# Patient Record
Sex: Female | Born: 1980 | Race: White | Hispanic: No | Marital: Married | Smoking: Never smoker
Health system: Southern US, Community
[De-identification: ages and names within clinical notes are randomized; demographics above are authoritative.]

## PROBLEM LIST (undated history)

## (undated) ENCOUNTER — Inpatient Hospital Stay (HOSPITAL_COMMUNITY): Payer: Self-pay

## (undated) DIAGNOSIS — Z789 Other specified health status: Secondary | ICD-10-CM

## (undated) HISTORY — PX: NO PAST SURGERIES: SHX2092

---

## 2017-07-23 ENCOUNTER — Other Ambulatory Visit: Payer: Self-pay

## 2017-07-23 ENCOUNTER — Inpatient Hospital Stay (HOSPITAL_COMMUNITY)
Admission: AD | Admit: 2017-07-23 | Discharge: 2017-07-23 | Disposition: A | Discharge status: Home / Self Care | Payer: BLUE CROSS/BLUE SHIELD | Source: Ambulatory Visit | Attending: Obstetrics and Gynecology | Admitting: Obstetrics and Gynecology

## 2017-07-23 ENCOUNTER — Inpatient Hospital Stay (HOSPITAL_COMMUNITY): Payer: BLUE CROSS/BLUE SHIELD

## 2017-07-23 ENCOUNTER — Encounter (HOSPITAL_COMMUNITY): Payer: Self-pay

## 2017-07-23 DIAGNOSIS — R109 Unspecified abdominal pain: Secondary | ICD-10-CM | POA: Diagnosis present

## 2017-07-23 DIAGNOSIS — O3680X0 Pregnancy with inconclusive fetal viability, not applicable or unspecified: Secondary | ICD-10-CM

## 2017-07-23 DIAGNOSIS — O418X10 Other specified disorders of amniotic fluid and membranes, first trimester, not applicable or unspecified: Secondary | ICD-10-CM | POA: Diagnosis not present

## 2017-07-23 DIAGNOSIS — Z79899 Other long term (current) drug therapy: Secondary | ICD-10-CM | POA: Insufficient documentation

## 2017-07-23 DIAGNOSIS — O208 Other hemorrhage in early pregnancy: Secondary | ICD-10-CM | POA: Insufficient documentation

## 2017-07-23 DIAGNOSIS — Z3401 Encounter for supervision of normal first pregnancy, first trimester: Secondary | ICD-10-CM

## 2017-07-23 DIAGNOSIS — Z3A01 Less than 8 weeks gestation of pregnancy: Secondary | ICD-10-CM | POA: Diagnosis not present

## 2017-07-23 DIAGNOSIS — O468X1 Other antepartum hemorrhage, first trimester: Secondary | ICD-10-CM

## 2017-07-23 LAB — URINALYSIS, ROUTINE W REFLEX MICROSCOPIC
BILIRUBIN URINE: NEGATIVE
GLUCOSE, UA: NEGATIVE mg/dL
KETONES UR: 5 mg/dL — AB
Nitrite: NEGATIVE
Protein, ur: NEGATIVE mg/dL
Specific Gravity, Urine: 1.005 (ref 1.005–1.030)
pH: 6 (ref 5.0–8.0)

## 2017-07-23 LAB — WET PREP, GENITAL
Clue Cells Wet Prep HPF POC: NONE SEEN
Sperm: NONE SEEN
TRICH WET PREP: NONE SEEN
YEAST WET PREP: NONE SEEN

## 2017-07-23 LAB — POCT PREGNANCY, URINE: Preg Test, Ur: POSITIVE — AB

## 2017-07-23 NOTE — MAU Note (Signed)
Pt presents to MAU with complaints of brown vaginal spotting that started earlier today. Reports lower abdominal discomfort.

## 2017-07-23 NOTE — Discharge Instructions (Signed)
Subchorionic Hematoma °A subchorionic hematoma is a gathering of blood between the outer wall of the placenta and the inner wall of the womb (uterus). The placenta is the organ that connects the fetus to the wall of the uterus. The placenta performs the feeding, breathing (oxygen to the fetus), and waste removal (excretory work) of the fetus. °Subchorionic hematoma is the most common abnormality found on a result from ultrasonography done during the first trimester or early second trimester of pregnancy. If there has been little or no vaginal bleeding, early small hematomas usually shrink on their own and do not affect your baby or pregnancy. The blood is gradually absorbed over 1-2 weeks. When bleeding starts later in pregnancy or the hematoma is larger or occurs in an older pregnant woman, the outcome may not be as good. Larger hematomas may get bigger, which increases the chances for miscarriage. Subchorionic hematoma also increases the risk of premature detachment of the placenta from the uterus, preterm (premature) labor, and stillbirth. °Follow these instructions at home: °· Stay on bed rest if your health care provider recommends this. Although bed rest will not prevent more bleeding or prevent a miscarriage, your health care provider may recommend bed rest until you are advised otherwise. °· Avoid heavy lifting (more than 10 lb [4.5 kg]), exercise, sexual intercourse, or douching as directed by your health care provider. °· Keep track of the number of pads you use each day and how soaked (saturated) they are. Write down this information. °· Do not use tampons. °· Keep all follow-up appointments as directed by your health care provider. Your health care provider may ask you to have follow-up blood tests or ultrasound tests or both. °Get help right away if: °· You have severe cramps in your stomach, back, abdomen, or pelvis. °· You have a fever. °· You pass large clots or tissue. Save any tissue for your  health care provider to look at. °· Your bleeding increases or you become lightheaded, feel weak, or have fainting episodes. °This information is not intended to replace advice given to you by your health care provider. Make sure you discuss any questions you have with your health care provider. °Document Released: 05/01/2006 Document Revised: 06/22/2015 Document Reviewed: 08/13/2012 °Elsevier Interactive Patient Education © 2017 Elsevier Inc. ° °

## 2017-07-23 NOTE — MAU Provider Note (Signed)
History     CSN: [id number]  Arrival date and time: 07/23/17 1518   First Provider Initiated Contact with Patient 07/23/17 1616      Chief Complaint  Patient presents with  . Vaginal Bleeding  . Abdominal Pain  . Possible Pregnancy   HPI  Barbara Lambert is a 37 y.o. G1P0 at approximately six weeks GA by LMP who presents to MAU with report of abdominal cramping and spotting today. Denies leaking of fluid, fever, falls, or recent illness.  Denies sexual intercourse  OB History    Gravida  1   Para      Term      Preterm      AB      Living  0     SAB      TAB      Ectopic      Multiple      Live Births              History reviewed. No pertinent past medical history.  History reviewed. No pertinent surgical history.  History reviewed. No pertinent family history.  Social History   Tobacco Use  . Smoking status: Never Smoker  . Smokeless tobacco: Never Used  Substance Use Topics  . Alcohol use: Not Currently  . Drug use: Never    Allergies: No Known Allergies  Medications Prior to Admission  Medication Sig Dispense Refill Last Dose  . fluticasone (FLONASE) 50 MCG/ACT nasal spray Place 1 spray into both nostrils daily as needed for allergies or rhinitis.   Past Week at Unknown time  . Prenatal Vit-Fe Fumarate-FA (PRENATAL MULTIVITAMIN) TABS tablet Take 1 tablet by mouth daily at 12 noon.   07/22/2017 at Unknown time    Review of Systems  Gastrointestinal: Positive for abdominal pain. Negative for nausea and vomiting.       "crampy" pain 3/10 in lower abdomen  Genitourinary: Positive for vaginal bleeding. Negative for difficulty urinating, dyspareunia, dysuria, pelvic pain, vaginal discharge and vaginal pain.  Neurological: Negative for weakness and headaches.   Physical Exam   Blood pressure 117/81, pulse 97, temperature 98.5 F (36.9 C), resp. rate 16, height 4\' 7"  (1.397 m), weight 104 lb (47.2 kg), last menstrual period  06/07/2017.  Physical Exam  Nursing note and vitals reviewed. Constitutional: She is oriented to person, place, and time. She appears well-developed and well-nourished. She appears distressed.  Very anxious  Cardiovascular: Normal rate, regular rhythm, normal heart sounds and intact distal pulses.  Respiratory: Effort normal and breath sounds normal.  GI: Soft. Bowel sounds are normal.  Genitourinary: Vagina normal and uterus normal.  Genitourinary Comments: Scant brown mucus visible on exam  Neurological: She is alert and oriented to person, place, and time. She has normal reflexes.  Skin: Skin is warm and dry.  Psychiatric: She has a normal mood and affect. Her behavior is normal. Judgment and thought content normal.   Scant brown vaginal discharge visible on panties worn to MAU Small streaks of brown discharge predominantly mucus visible after exam  MAU Course  Procedures  MDM Orders Placed This Encounter  Procedures  . Wet prep, genital    Standing Status:   Standing    Number of Occurrences:   1  . US OB LESS THAN 14 WEEKS WITH OB TRANSVAGINAL    Cramping and spotting in first trimester    Standing Status:   Standing    Number of Occurrences:   1    Order Specific Question:  Symptom/Reason for Exam    Answer:   Pregnancy of unknown anatomic location [741014]  . Urinalysis, Routine w reflex microscopic    Standing Status:   Standing    Number of Occurrences:   1  . Pregnancy, urine POC    Standing Status:   Standing    Number of Occurrences:   1   Results for orders placed or performed during the hospital encounter of 07/23/17 (from the past 24 hour(s))  Urinalysis, Routine w reflex microscopic     Status: Abnormal   Collection Time: 07/23/17  3:57 PM  Result Value Ref Range   Color, Urine STRAW (A) YELLOW   APPearance CLEAR CLEAR   Specific Gravity, Urine 1.005 1.005 - 1.030   pH 6.0 5.0 - 8.0   Glucose, UA NEGATIVE NEGATIVE mg/dL   Hgb urine dipstick MODERATE  (A) NEGATIVE   Bilirubin Urine NEGATIVE NEGATIVE   Ketones, ur 5 (A) NEGATIVE mg/dL   Protein, ur NEGATIVE NEGATIVE mg/dL   Nitrite NEGATIVE NEGATIVE   Leukocytes, UA TRACE (A) NEGATIVE   RBC / HPF 0-5 0 - 5 RBC/hpf   WBC, UA 0-5 0 - 5 WBC/hpf   Bacteria, UA RARE (A) NONE SEEN   Squamous Epithelial / LPF 0-5 0 - 5  Pregnancy, urine POC     Status: Abnormal   Collection Time: 07/23/17  4:02 PM  Result Value Ref Range   Preg Test, Ur POSITIVE (A) NEGATIVE  Wet prep, genital     Status: Abnormal   Collection Time: 07/23/17  4:26 PM  Result Value Ref Range   Yeast Wet Prep HPF POC NONE SEEN NONE SEEN   Trich, Wet Prep NONE SEEN NONE SEEN   Clue Cells Wet Prep HPF POC NONE SEEN NONE SEEN   WBC, Wet Prep HPF POC MODERATE (A) NONE SEEN   Sperm NONE SEEN     US Ob Less Than 14 Weeks With Ob Transvaginal  Result Date: 07/23/2017 CLINICAL DATA:  37 year old pregnant female with spotting and lower abdominal discomfort. EDC by LMP: 03/14/2018, projecting to an expected gestational age of [redacted] weeks 4 days. EXAM: OBSTETRIC <14 WK [place] AND TRANSVAGINAL OB US TECHNIQUE: Both transabdominal and transvaginal ultrasound examinations were performed for complete evaluation of the gestation as well as the maternal uterus, adnexal regions, and pelvic cul-de-sac. Transvaginal technique was performed to assess early pregnancy. COMPARISON:  None. FINDINGS: Intrauterine gestational sac: Single intrauterine gestational sac appears normal in size, shape and position. Yolk sac:  Visualized. Embryo:  Visualized. Cardiac Activity: Visualized. Heart Rate: 126 bpm CRL:  7.7 mm   6 w   4 d                  [place] EDC: 03/14/2018 Subchorionic hemorrhage: Moderate perigestational bleed involving approximately 50% of the gestational sac circumference and extending into lower cavity and endocervical canal. Maternal uterus/adnexae: Left ovary measures 4.0 x 2.7 x 2.1 cm and contains a corpus luteum. Right ovary measures 3.9 x 1.9 x 2.0  cm. No abnormal ovarian or adnexal masses. Anteverted uterus, with no uterine fibroids. No abnormal free fluid in the pelvis. IMPRESSION: 1. Single living intrauterine gestation at 6 weeks 4 days by crown-rump length, concordant with provided menstrual dating. Normal embryonic cardiac activity. 2. Moderate perigestational bleed as detailed. 3. No abnormal ovarian or adnexal masses. Electronically Signed   By: Delbert Phenix M.D.   On: 07/23/2017 17:23     Assessment and Plan  --37 y.o. G1P0 with  SIUP at 6w 4d by US performed today --Subchorionic hemorrhage.  --Discussed possibility of future bleeding episodes and confirmed patient should return to MAU if they occur --Reviewed pelvis rest protocol, nothing in vagina --Given safe medications handout --Patient planning to initiate [facility] --Discharge home in stable condition  Calvert Cantor, CNM 07/23/2017, 5:40 PM

## 2017-07-24 LAB — GC/CHLAMYDIA PROBE AMP (~~LOC~~) NOT AT ARMC
CHLAMYDIA, DNA PROBE: NEGATIVE
Neisseria Gonorrhea: NEGATIVE

## 2017-09-10 ENCOUNTER — Inpatient Hospital Stay (HOSPITAL_COMMUNITY)
Admission: AD | Admit: 2017-09-10 | Discharge: 2017-09-10 | Disposition: A | Discharge status: Home / Self Care | Payer: BLUE CROSS/BLUE SHIELD | Source: Ambulatory Visit | Attending: Obstetrics and Gynecology | Admitting: Obstetrics and Gynecology

## 2017-09-10 ENCOUNTER — Inpatient Hospital Stay (HOSPITAL_COMMUNITY): Payer: BLUE CROSS/BLUE SHIELD

## 2017-09-10 ENCOUNTER — Other Ambulatory Visit: Payer: Self-pay

## 2017-09-10 ENCOUNTER — Encounter (HOSPITAL_COMMUNITY): Payer: Self-pay | Admitting: *Deleted

## 2017-09-10 DIAGNOSIS — O4690 Antepartum hemorrhage, unspecified, unspecified trimester: Secondary | ICD-10-CM

## 2017-09-10 DIAGNOSIS — O208 Other hemorrhage in early pregnancy: Secondary | ICD-10-CM | POA: Insufficient documentation

## 2017-09-10 DIAGNOSIS — O468X9 Other antepartum hemorrhage, unspecified trimester: Secondary | ICD-10-CM

## 2017-09-10 DIAGNOSIS — O09519 Supervision of elderly primigravida, unspecified trimester: Secondary | ICD-10-CM

## 2017-09-10 DIAGNOSIS — Z3A13 13 weeks gestation of pregnancy: Secondary | ICD-10-CM | POA: Diagnosis not present

## 2017-09-10 DIAGNOSIS — O418X90 Other specified disorders of amniotic fluid and membranes, unspecified trimester, not applicable or unspecified: Secondary | ICD-10-CM

## 2017-09-10 DIAGNOSIS — O209 Hemorrhage in early pregnancy, unspecified: Secondary | ICD-10-CM | POA: Diagnosis present

## 2017-09-10 DIAGNOSIS — O4691 Antepartum hemorrhage, unspecified, first trimester: Secondary | ICD-10-CM

## 2017-09-10 DIAGNOSIS — Z3492 Encounter for supervision of normal pregnancy, unspecified, second trimester: Secondary | ICD-10-CM

## 2017-09-10 HISTORY — DX: Other specified health status: Z78.9

## 2017-09-10 LAB — TYPE AND SCREEN
ABO/RH(D): B POS
ANTIBODY SCREEN: NEGATIVE

## 2017-09-10 LAB — URINALYSIS, ROUTINE W REFLEX MICROSCOPIC
Bilirubin Urine: NEGATIVE
Glucose, UA: NEGATIVE mg/dL
Ketones, ur: NEGATIVE mg/dL
Leukocytes, UA: NEGATIVE
Nitrite: NEGATIVE
PROTEIN: NEGATIVE mg/dL
SPECIFIC GRAVITY, URINE: 1.002 — AB (ref 1.005–1.030)
pH: 6 (ref 5.0–8.0)

## 2017-09-10 LAB — CBC
HEMATOCRIT: 37.4 % (ref 36.0–46.0)
HEMOGLOBIN: 13.1 g/dL (ref 12.0–15.0)
MCH: 29.8 pg (ref 26.0–34.0)
MCHC: 35 g/dL (ref 30.0–36.0)
MCV: 85.2 fL (ref 78.0–100.0)
Platelets: 268 10*3/uL (ref 150–400)
RBC: 4.39 MIL/uL (ref 3.87–5.11)
RDW: 12.6 % (ref 11.5–15.5)
WBC: 12.1 10*3/uL — AB (ref 4.0–10.5)

## 2017-09-10 LAB — WET PREP, GENITAL
Clue Cells Wet Prep HPF POC: NONE SEEN
Sperm: NONE SEEN
Trich, Wet Prep: NONE SEEN
Yeast Wet Prep HPF POC: NONE SEEN

## 2017-09-10 LAB — HCG, QUANTITATIVE, PREGNANCY: hCG, Beta Chain, Quant, S: 64435 m[IU]/mL — ABNORMAL HIGH (ref <5)

## 2017-09-10 LAB — ABO/RH: ABO/RH(D): B POS

## 2017-09-10 NOTE — MAU Note (Signed)
Has been bleeding since Monday, started as 2 drops of dark red, then reddish pink when wiped, the next day was brown, today light brown.some discomfort when she urinates.

## 2017-09-10 NOTE — MAU Note (Signed)
Urine in lab 

## 2017-09-10 NOTE — MAU Provider Note (Signed)
History     CSN: [id number]  Arrival date and time: 09/10/17 1315   First Provider Initiated Contact with Patient 09/10/17 1511      Chief Complaint  Patient presents with  . Dysuria  . Vaginal Bleeding   HPI  Barbara Lambert is a 37 y.o. G1P0 at 8943w4d who presents to MAU with chief complaint of vaginal bleeding since Monday 09/08/17. Patient endorses vaginal spotting that ranges from pale pink mucous to bright red spotting. Also reports "a little tingle" in her stomach when she voids, new onset Monday 09/08/2017. Denies heavy vaginal bleeding, change in vaginal discharge, fever, falls, or recent illness.  Endorses compliance with previous recommendation for pelvic rest based on diagnosis of subchorionic hemorrhage.  OB History    Gravida  1   Para      Term      Preterm      AB      Living  0     SAB      TAB      Ectopic      Multiple      Live Births              Past Medical History:  Diagnosis Date  . Medical history non-contributory     Past Surgical History:  Procedure Laterality Date  . NO PAST SURGERIES      History reviewed. No pertinent family history.  Social History   Tobacco Use  . Smoking status: Never Smoker  . Smokeless tobacco: Never Used  Substance Use Topics  . Alcohol use: Not Currently  . Drug use: Never    Allergies: No Known Allergies  Medications Prior to Admission  Medication Sig Dispense Refill Last Dose  . Prenatal Vit-Fe Fumarate-FA (PRENATAL MULTIVITAMIN) TABS tablet Take 1 tablet by mouth daily at 12 noon.   07/22/2017 at Unknown time    Review of Systems  Constitutional: Negative for fever.  Respiratory: Negative for shortness of breath.   Gastrointestinal: Negative for abdominal pain, nausea and vomiting.  Genitourinary: Positive for dysuria and vaginal bleeding. Negative for difficulty urinating, vaginal discharge and vaginal pain.  Neurological: Negative for headaches.  All other systems reviewed and are  negative.  Physical Exam   Blood pressure 114/76, pulse 96, temperature 98.4 F (36.9 C), temperature source Oral, resp. rate 17, weight 47.9 kg, last menstrual period 06/07/2017, SpO2 100 %.  Physical Exam  Nursing note and vitals reviewed. Constitutional: She is oriented to person, place, and time. She appears well-developed and well-nourished.  Cardiovascular: Normal rate and intact distal pulses.  Respiratory: Effort normal.  GI: Soft. Bowel sounds are normal. There is no tenderness. There is no rebound and no guarding.  Genitourinary: Vagina normal and uterus normal.  Musculoskeletal: Normal range of motion.  Neurological: She is alert and oriented to person, place, and time. She has normal reflexes.  Skin: Skin is warm and dry.  Psychiatric: She has a normal mood and affect. Her behavior is normal. Judgment and thought content normal.    MAU Course  Procedures  MDM --FHT 152 in MAU --No abnormal vaginal discharge or bleeding visible on swab collection --Patient declined bimanual exam --previously diagnosed subchorionic hematoma  Patient Vitals for the past 24 hrs:  BP Temp Temp src Pulse Resp SpO2 Weight  09/10/17 1410 114/76 98.4 F (36.9 C) Oral 96 17 100 % 47.9 kg    Orders Placed This Encounter  Procedures  . Wet prep, genital  . US OB LESS  THAN 14 WEEKS WITH OB TRANSVAGINAL  . Urinalysis, Routine w reflex microscopic  . CBC  . hCG, quantitative, pregnancy  . Type and screen [facility] [place]  . ABO/Rh    Results for orders placed or performed during the hospital encounter of 09/10/17 (from the past 24 hour(s))  Urinalysis, Routine w reflex microscopic     Status: Abnormal   Collection Time: 09/10/17  1:24 PM  Result Value Ref Range   Color, Urine STRAW (A) YELLOW   APPearance CLEAR CLEAR   Specific Gravity, Urine 1.002 (L) 1.005 - 1.030   pH 6.0 5.0 - 8.0   Glucose, UA NEGATIVE NEGATIVE mg/dL   Hgb urine dipstick SMALL (A) NEGATIVE    Bilirubin Urine NEGATIVE NEGATIVE   Ketones, ur NEGATIVE NEGATIVE mg/dL   Protein, ur NEGATIVE NEGATIVE mg/dL   Nitrite NEGATIVE NEGATIVE   Leukocytes, UA NEGATIVE NEGATIVE   RBC / HPF 0-5 0 - 5 RBC/hpf   WBC, UA 0-5 0 - 5 WBC/hpf   Bacteria, UA RARE (A) NONE SEEN   Squamous Epithelial / LPF 0-5 0 - 5  CBC     Status: Abnormal   Collection Time: 09/10/17  2:40 PM  Result Value Ref Range   WBC 12.1 (H) 4.0 - 10.5 K/uL   RBC 4.39 3.87 - 5.11 MIL/uL   Hemoglobin 13.1 12.0 - 15.0 g/dL   HCT 03.4 74.2 - 59.5 %   MCV 85.2 78.0 - 100.0 fL   MCH 29.8 26.0 - 34.0 pg   MCHC 35.0 30.0 - 36.0 g/dL   RDW 63.8 75.6 - 43.3 %   Platelets 268 150 - 400 K/uL  hCG, quantitative, pregnancy     Status: Abnormal   Collection Time: 09/10/17  2:40 PM  Result Value Ref Range   hCG, Beta Chain, Quant, S 64,435 (H) <5 mIU/mL  Type and screen [facility]      Status: None   Collection Time: 09/10/17  2:40 PM  Result Value Ref Range   ABO/RH(D) B POS    Antibody Screen NEG    Sample Expiration      09/13/2017 Performed at [facility], [address], [place], [place] [address]   ABO/Rh     Status: None   Collection Time: 09/10/17  2:40 PM  Result Value Ref Range   ABO/RH(D)      B POS Performed at [facility], [address], [place], [place] [address]   Wet prep, genital     Status: Abnormal   Collection Time: 09/10/17  3:21 PM  Result Value Ref Range   Yeast Wet Prep HPF POC NONE SEEN NONE SEEN   Trich, Wet Prep NONE SEEN NONE SEEN   Clue Cells Wet Prep HPF POC NONE SEEN NONE SEEN   WBC, Wet Prep HPF POC FEW (A) NONE SEEN   Sperm NONE SEEN    US Ob Comp Less 14 Wks  Result Date: 09/10/2017 CLINICAL DATA:  Pregnant, vaginal bleeding EXAM: OBSTETRIC <14 WK ULTRASOUND TECHNIQUE: Transvaginal ultrasound was performed for complete evaluation of the gestation as well as the maternal uterus, adnexal regions, and pelvic cul-de-sac. COMPARISON:  None. FINDINGS:  Intrauterine gestational sac: Single Yolk sac:  Not Visualized. Embryo:  Visualized. Cardiac Activity: Visualized. Heart Rate: 146 bpm CRL:   74.2 mm   13 w 4 d                  [place] EDC: 03/14/2018 Subchorionic hemorrhage:  None visualized.  Maternal uterus/adnexae: Bilateral ovaries are not discretely visualized. No free fluid. IMPRESSION: Single live intrauterine gestation, with estimated gestational age [redacted] weeks 4 days by crown-rump length, as above. Electronically Signed   By: Charline BillsSriyesh  Krishnan M.D.   On: 09/10/2017 16:38       Assessment and Plan  --37 y.o. G1P0 at 5773w4d  --FHT 154 via Doppler --Subchorionic hemorrhage now resolved --Blood type B POSITIVE --Discharge home in stable condition  Presentation, clinical findings, and plan discussed with Dr. Richardson Doppole.   Calvert CantorSamantha C Weinhold, CNM 09/10/2017, 4:53 PM

## 2017-09-10 NOTE — Discharge Instructions (Signed)
Second Trimester of Pregnancy The second trimester is from week 13 through week 28, month 4 through 6. This is often the time in pregnancy that you feel your best. Often times, morning sickness has lessened or quit. You may have more energy, and you may get hungry more often. Your unborn baby (fetus) is growing rapidly. At the end of the sixth month, he or she is about 9 inches long and weighs about 1 pounds. You will likely feel the baby move (quickening) between 18 and 20 weeks of pregnancy. Follow these instructions at home:  Avoid all smoking, herbs, and alcohol. Avoid drugs not approved by your doctor.  Do not use any tobacco products, including cigarettes, chewing tobacco, and electronic cigarettes. If you need help quitting, ask your doctor. You may get counseling or other support to help you quit.  Only take medicine as told by your doctor. Some medicines are safe and some are not during pregnancy.  Exercise only as told by your doctor. Stop exercising if you start having cramps.  Eat regular, healthy meals.  Wear a good support bra if your breasts are tender.  Do not use hot tubs, steam rooms, or saunas.  Wear your seat belt when driving.  Avoid raw meat, uncooked cheese, and liter boxes and soil used by cats.  Take your prenatal vitamins.  Take 1500-2000 milligrams of calcium daily starting at the 20th week of pregnancy until you deliver your baby.  Try taking medicine that helps you poop (stool softener) as needed, and if your doctor approves. Eat more fiber by eating fresh fruit, vegetables, and whole grains. Drink enough fluids to keep your pee (urine) clear or pale yellow.  Take warm water baths (sitz baths) to soothe pain or discomfort caused by hemorrhoids. Use hemorrhoid cream if your doctor approves.  If you have puffy, bulging veins (varicose veins), wear support hose. Raise (elevate) your feet for 15 minutes, 3-4 times a day. Limit salt in your diet.  Avoid heavy  lifting, wear low heals, and sit up straight.  Rest with your legs raised if you have leg cramps or low back pain.  Visit your dentist if you have not gone during your pregnancy. Use a soft toothbrush to brush your teeth. Be gentle when you floss.  You can have sex (intercourse) unless your doctor tells you not to.  Go to your doctor visits. Get help if:  You feel dizzy.  You have mild cramps or pressure in your lower belly (abdomen).  You have a nagging pain in your belly area.  You continue to feel sick to your stomach (nauseous), throw up (vomit), or have watery poop (diarrhea).  You have bad smelling fluid coming from your vagina.  You have pain with peeing (urination). Get help right away if:  You have a fever.  You are leaking fluid from your vagina.  You have spotting or bleeding from your vagina.  You have severe belly cramping or pain.  You lose or gain weight rapidly.  You have trouble catching your breath and have chest pain.  You notice sudden or extreme puffiness (swelling) of your face, hands, ankles, feet, or legs.  You have not felt the baby move in over an hour.  You have severe headaches that do not go away with medicine.  You have vision changes. This information is not intended to replace advice given to you by your health care provider. Make sure you discuss any questions you have with your health care   provider. Document Released: 04/10/2009 Document Revised: 06/22/2015 Document Reviewed: 03/17/2012 Elsevier Interactive Patient Education  2017 Elsevier Inc.  

## 2017-09-17 ENCOUNTER — Encounter (HOSPITAL_COMMUNITY): Payer: Self-pay

## 2017-09-17 ENCOUNTER — Inpatient Hospital Stay (HOSPITAL_COMMUNITY)
Admission: AD | Admit: 2017-09-17 | Discharge: 2017-09-17 | Disposition: A | Discharge status: Home / Self Care | Payer: BLUE CROSS/BLUE SHIELD | Source: Ambulatory Visit | Attending: Obstetrics & Gynecology | Admitting: Obstetrics & Gynecology

## 2017-09-17 DIAGNOSIS — Z3A14 14 weeks gestation of pregnancy: Secondary | ICD-10-CM | POA: Insufficient documentation

## 2017-09-17 DIAGNOSIS — O209 Hemorrhage in early pregnancy, unspecified: Secondary | ICD-10-CM | POA: Diagnosis present

## 2017-09-17 DIAGNOSIS — N939 Abnormal uterine and vaginal bleeding, unspecified: Secondary | ICD-10-CM

## 2017-09-17 DIAGNOSIS — O4692 Antepartum hemorrhage, unspecified, second trimester: Secondary | ICD-10-CM

## 2017-09-17 LAB — URINALYSIS, ROUTINE W REFLEX MICROSCOPIC
Bacteria, UA: NONE SEEN
Bilirubin Urine: NEGATIVE
GLUCOSE, UA: NEGATIVE mg/dL
Ketones, ur: NEGATIVE mg/dL
LEUKOCYTES UA: NEGATIVE
NITRITE: NEGATIVE
PROTEIN: NEGATIVE mg/dL
Specific Gravity, Urine: 1 — ABNORMAL LOW (ref 1.005–1.030)
pH: 8 (ref 5.0–8.0)

## 2017-09-17 NOTE — Discharge Instructions (Signed)
Second Trimester of Pregnancy The second trimester is from week 13 through week 28, month 4 through 6. This is often the time in pregnancy that you feel your best. Often times, morning sickness has lessened or quit. You may have more energy, and you may get hungry more often. Your unborn baby (fetus) is growing rapidly. At the end of the sixth month, he or she is about 9 inches long and weighs about 1 pounds. You will likely feel the baby move (quickening) between 18 and 20 weeks of pregnancy. Follow these instructions at home:  Avoid all smoking, herbs, and alcohol. Avoid drugs not approved by your doctor.  Do not use any tobacco products, including cigarettes, chewing tobacco, and electronic cigarettes. If you need help quitting, ask your doctor. You may get counseling or other support to help you quit.  Only take medicine as told by your doctor. Some medicines are safe and some are not during pregnancy.  Exercise only as told by your doctor. Stop exercising if you start having cramps.  Eat regular, healthy meals.  Wear a good support bra if your breasts are tender.  Do not use hot tubs, steam rooms, or saunas.  Wear your seat belt when driving.  Avoid raw meat, uncooked cheese, and liter boxes and soil used by cats.  Take your prenatal vitamins.  Take 1500-2000 milligrams of calcium daily starting at the 20th week of pregnancy until you deliver your baby.  Try taking medicine that helps you poop (stool softener) as needed, and if your doctor approves. Eat more fiber by eating fresh fruit, vegetables, and whole grains. Drink enough fluids to keep your pee (urine) clear or pale yellow.  Take warm water baths (sitz baths) to soothe pain or discomfort caused by hemorrhoids. Use hemorrhoid cream if your doctor approves.  If you have puffy, bulging veins (varicose veins), wear support hose. Raise (elevate) your feet for 15 minutes, 3-4 times a day. Limit salt in your diet.  Avoid heavy  lifting, wear low heals, and sit up straight.  Rest with your legs raised if you have leg cramps or low back pain.  Visit your dentist if you have not gone during your pregnancy. Use a soft toothbrush to brush your teeth. Be gentle when you floss.  You can have sex (intercourse) unless your doctor tells you not to.  Go to your doctor visits. Get help if:  You feel dizzy.  You have mild cramps or pressure in your lower belly (abdomen).  You have a nagging pain in your belly area.  You continue to feel sick to your stomach (nauseous), throw up (vomit), or have watery poop (diarrhea).  You have bad smelling fluid coming from your vagina.  You have pain with peeing (urination). Get help right away if:  You have a fever.  You are leaking fluid from your vagina.  You have spotting or bleeding from your vagina.  You have severe belly cramping or pain.  You lose or gain weight rapidly.  You have trouble catching your breath and have chest pain.  You notice sudden or extreme puffiness (swelling) of your face, hands, ankles, feet, or legs.  You have not felt the baby move in over an hour.  You have severe headaches that do not go away with medicine.  You have vision changes. This information is not intended to replace advice given to you by your health care provider. Make sure you discuss any questions you have with your health care   provider. Document Released: 04/10/2009 Document Revised: 06/22/2015 Document Reviewed: 03/17/2012 Elsevier Interactive Patient Education  2017 Elsevier Inc.  

## 2017-09-17 NOTE — MAU Provider Note (Signed)
History     CSN: [id number]  Arrival date and time: 09/17/17 [id number]   First Provider Initiated Contact with Patient 09/17/17 0732      Chief Complaint  Patient presents with  . Vaginal Bleeding   Barbara Lambert is a 37 y.o. G1P0 at 6255w4d who presents today with vaginal bleeding. She has been seen here for this same complaint 2 other times. [facility], and this was resolved on US on 09/10/17. However, she reports that she has continued to bleed, and today it was heavier. She denies any pain.   Vaginal Bleeding  The patient's primary symptoms include vaginal bleeding. This is a new problem. The current episode started in the past 7 days. The problem occurs intermittently. The problem has been gradually worsening. The patient is experiencing no pain. She is pregnant. Pertinent negatives include no chills, dysuria, fever, frequency, nausea, urgency or vomiting. The vaginal discharge was bloody. The vaginal bleeding is typical of menses. She has not been passing clots. She has not been passing tissue. Nothing aggravates the symptoms. She has tried nothing for the symptoms.    OB History    Gravida  1   Para      Term      Preterm      AB      Living  0     SAB      TAB      Ectopic      Multiple      Live Births              Past Medical History:  Diagnosis Date  . Medical history non-contributory     Past Surgical History:  Procedure Laterality Date  . NO PAST SURGERIES      No family history on file.  Social History   Tobacco Use  . Smoking status: Never Smoker  . Smokeless tobacco: Never Used  Substance Use Topics  . Alcohol use: Not Currently  . Drug use: Never    Allergies: No Known Allergies  Medications Prior to Admission  Medication Sig Dispense Refill Last Dose  . Prenatal Vit-Fe Fumarate-FA (PRENATAL MULTIVITAMIN) TABS tablet Take 1 tablet by mouth daily at 12 noon.   09/16/2017 at Unknown time    Review of Systems   Constitutional: Negative for chills and fever.  Gastrointestinal: Negative for nausea and vomiting.  Genitourinary: Positive for vaginal bleeding. Negative for dysuria, frequency and urgency.   Physical Exam   Blood pressure 115/69, pulse 89, temperature 97.6 F (36.4 C), resp. rate 16, last menstrual period 06/07/2017, SpO2 100 %.  Physical Exam  Nursing note and vitals reviewed. Constitutional: She is oriented to person, place, and time. She appears well-developed and well-nourished. No distress.  HENT:  Head: Normocephalic.  Cardiovascular: Normal rate.  Respiratory: Effort normal.  GI: Soft. There is no tenderness. There is no rebound.  Neurological: She is alert and oriented to person, place, and time.  Skin: Skin is warm and dry.  Psychiatric: She has a normal mood and affect.     Pt informed that the ultrasound is considered a limited OB ultrasound and is not intended to be a complete ultrasound exam.  Patient also informed that the ultrasound is not being completed with the intent of assessing for fetal or placental anomalies or any pelvic abnormalities.  Explained that the purpose of today's ultrasound is to assess for  viability.  Patient acknowledges the purpose of the exam and  the limitations of the study.    + cardiac activity  MAU Course  Procedures  MDM  8:22 AM DW Dr. Charlotta Newtonzan: reviewed bleeding and US from today. Patient is ok for DC home, FU in the office as planned on 09/19/17.    Assessment and Plan   1. Vagina bleeding   2. [redacted] weeks gestation of pregnancy    DC home Comfort measures reviewed  1st Trimester precautions  Bleeding precautions RX: none  Return to MAU as needed FU with OB as planned  Follow-up Information    Myna Hidalgozan, Jennifer, DO Follow up.   Specialty:  Obstetrics and Gynecology Contact information: [address] [place] [address] [phone]            Thressa ShellerHeather Hogan 09/17/2017, 7:53 AM

## 2017-09-17 NOTE — MAU Note (Signed)
Pt reports bright red vaginal bleeding that started around 0330. States that it is like a period. Pt denies pain

## 2018-06-06 ENCOUNTER — Encounter (HOSPITAL_COMMUNITY): Payer: Self-pay

## 2020-05-05 IMAGING — US US OB < 14 WEEKS - US OB TV
1 series · 15 of 28 positions shown · non-contrast
Comparison: None.

CLINICAL DATA: 37-year-old pregnant female with spotting and lower
abdominal discomfort.

EDC by LMP: 03/14/2018, projecting to an expected gestational age of
6 weeks 4 days.
EXAM:
OBSTETRIC <14 WK US AND TRANSVAGINAL OB US
TECHNIQUE: Both transabdominal and transvaginal ultrasound examinations were
performed for complete evaluation of the gestation as well as the
maternal uterus, adnexal regions, and pelvic cul-de-sac.
Transvaginal technique was performed to assess early pregnancy.

[Series 1: us ob < 14 weeks - us ob tv · 38 acquisitions, 15 frames shown]
[im 1/38]
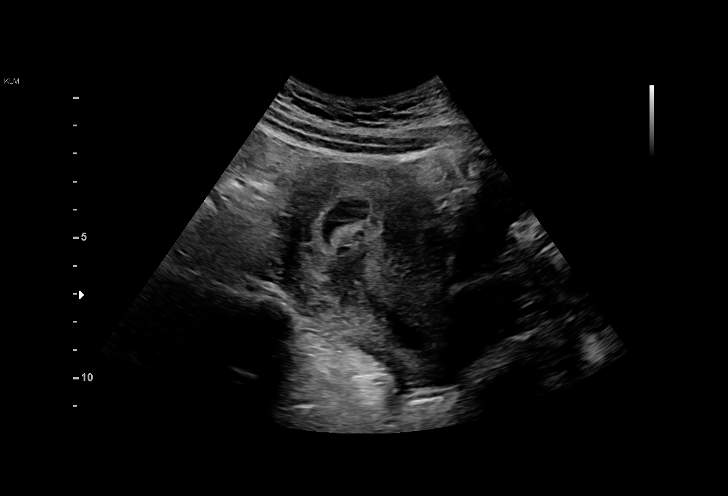
[im 3/38]
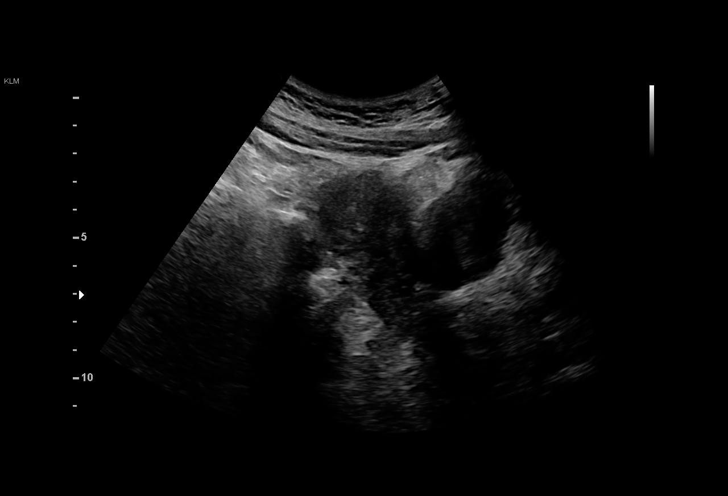
[im 6/38]
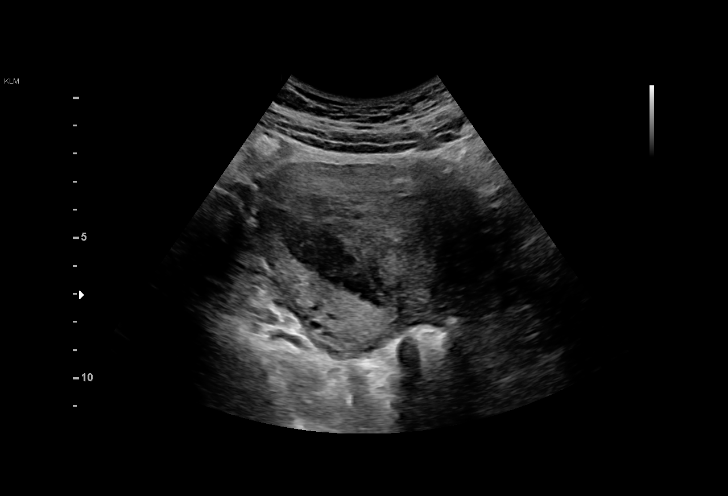
[im 9/38]
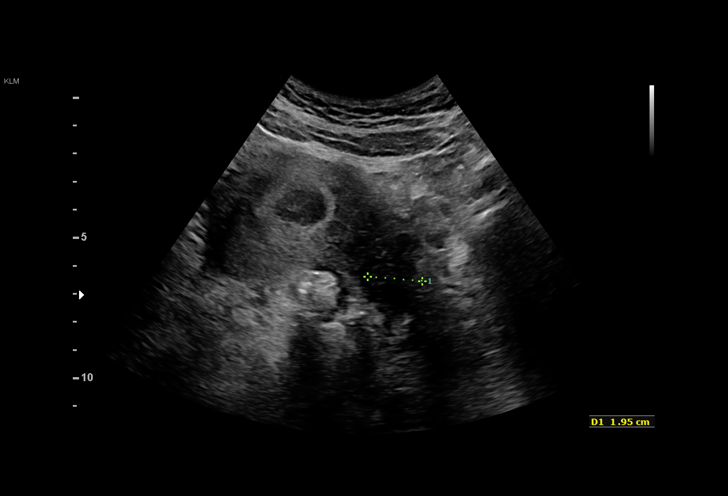
[im 11/38]
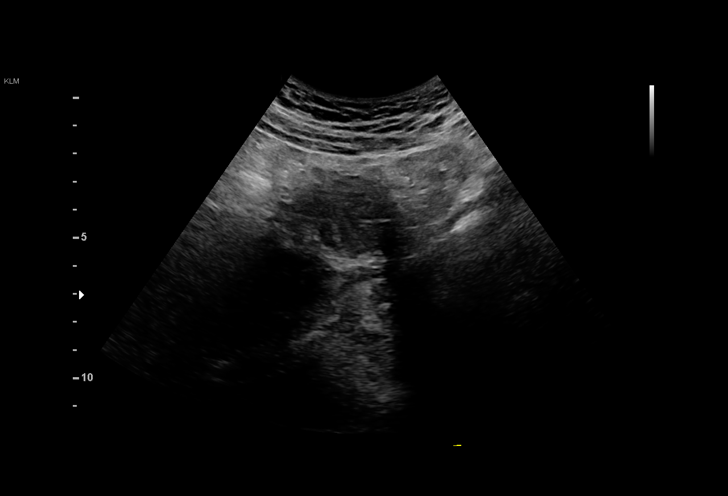
[im 14/38]
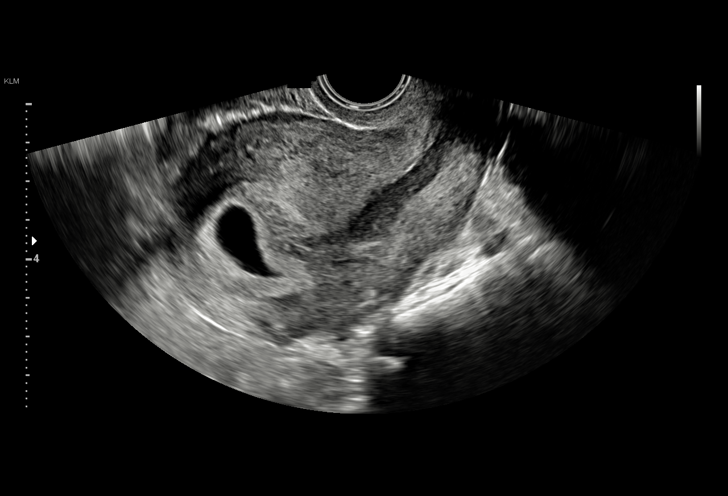
[im 17/38]
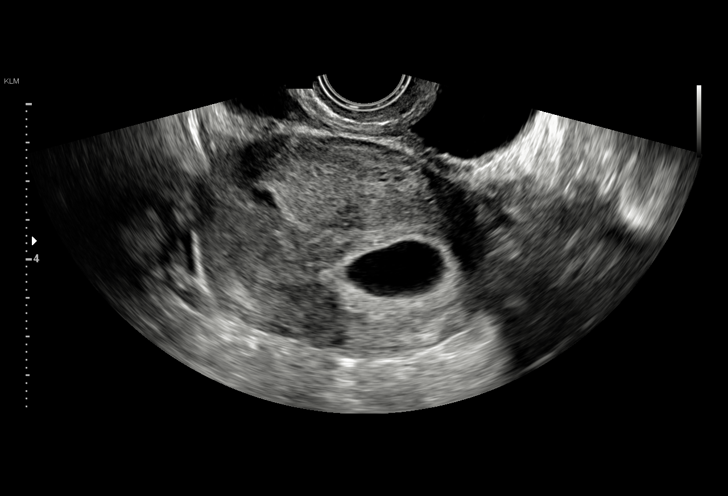
[im 20/38]
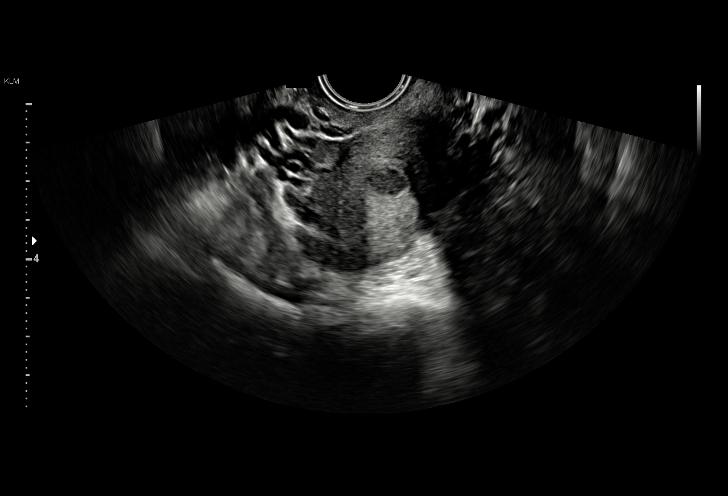
[im 21/38]
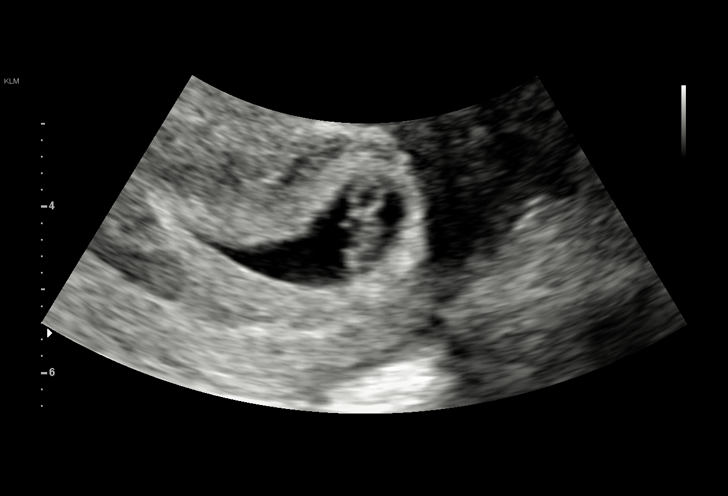
[im 24/38]
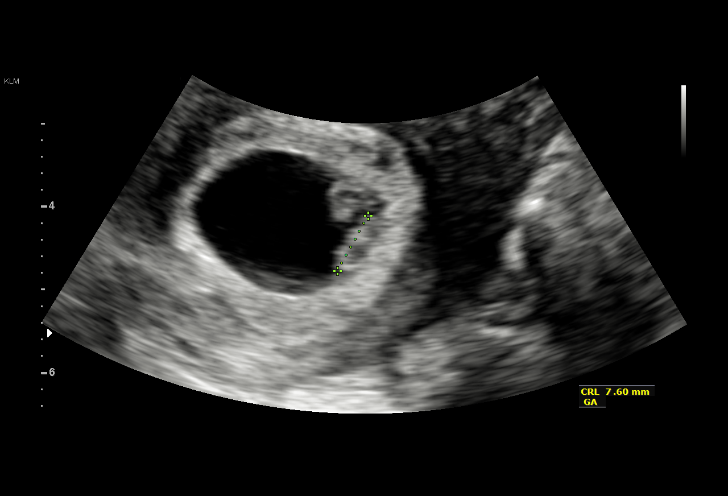
[im 27/38]
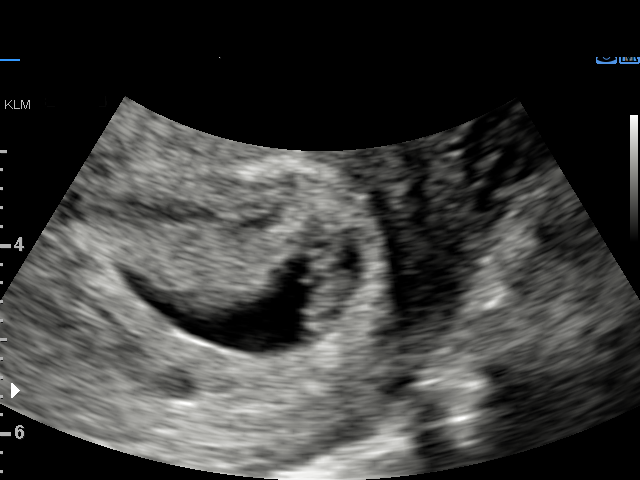
[im 29/38]
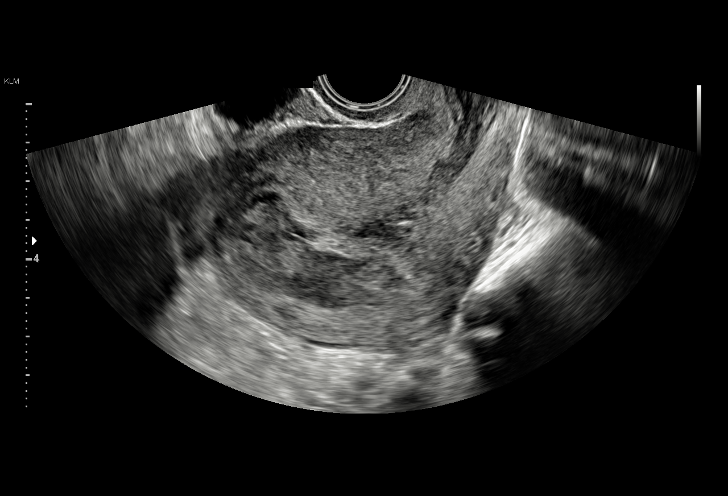
[im 32/38]
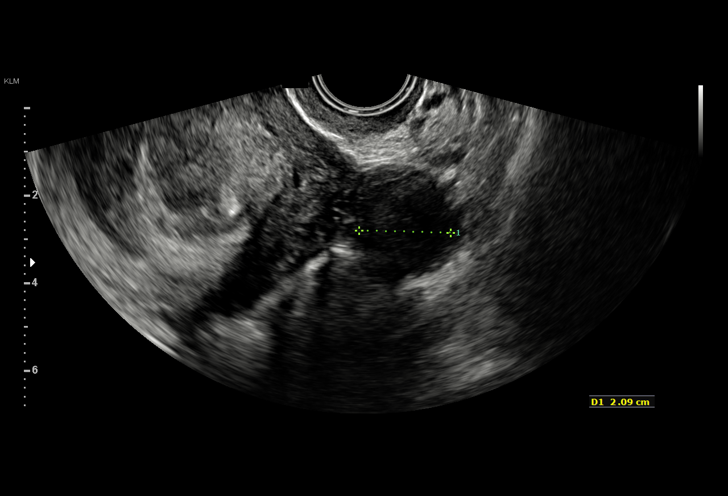
[im 35/38]
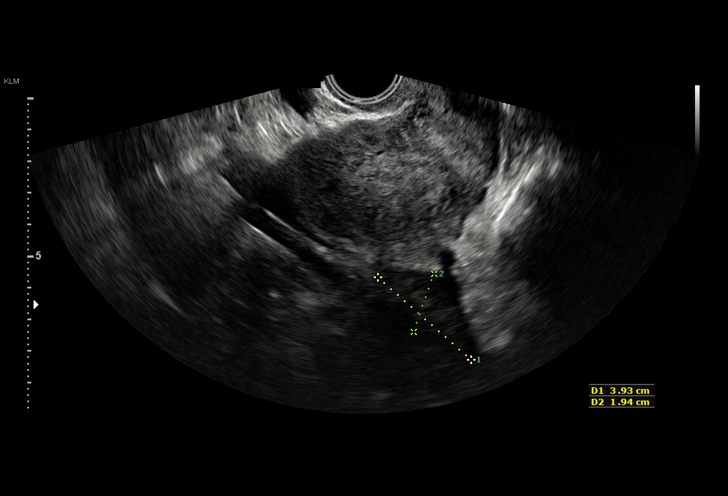
[im 38/38]
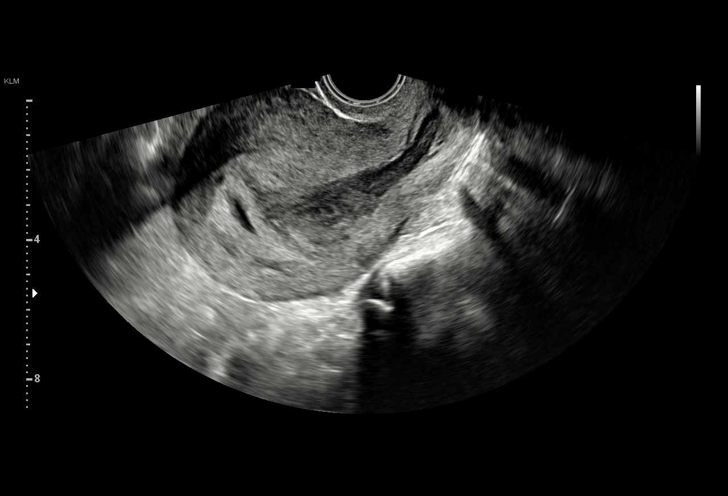

[15 of 28 positions shown; findings below may reference images not displayed]

FINDINGS: Intrauterine gestational sac: Single intrauterine gestational sac
appears normal in size, shape and position.

Yolk sac:  Visualized.

Embryo:  Visualized.

Cardiac Activity: Visualized.

Heart Rate: 126 bpm

CRL:  7.7 mm   6 w   4 d                  US EDC: 03/14/2018

Subchorionic hemorrhage: Moderate perigestational bleed involving
approximately 50% of the gestational sac circumference and extending
into lower cavity and endocervical canal.

Maternal uterus/adnexae: Left ovary measures 4.0 x 2.7 x 2.1 cm and
contains a corpus luteum. Right ovary measures 3.9 x 1.9 x 2.0 cm.
No abnormal ovarian or adnexal masses. Anteverted uterus, with no
uterine fibroids. No abnormal free fluid in the pelvis.
IMPRESSION: 1. Single living intrauterine gestation at 6 weeks 4 days by
crown-rump length, concordant with provided menstrual dating. Normal
embryonic cardiac activity.
2. Moderate perigestational bleed as detailed.
3. No abnormal ovarian or adnexal masses.

## 2020-06-23 IMAGING — US US OB COMP LESS 14 WK
1 series · 13 of 13 positions shown · non-contrast
Comparison: None.

CLINICAL DATA: Pregnant, vaginal bleeding

EXAM:
OBSTETRIC <14 WK ULTRASOUND
TECHNIQUE: Transvaginal ultrasound was performed for complete evaluation of the
gestation as well as the maternal uterus, adnexal regions, and
pelvic cul-de-sac.

[Series 1: us ob comp less 14 wk · 13 acquisitions, 13 frames shown]
[im 1/13]
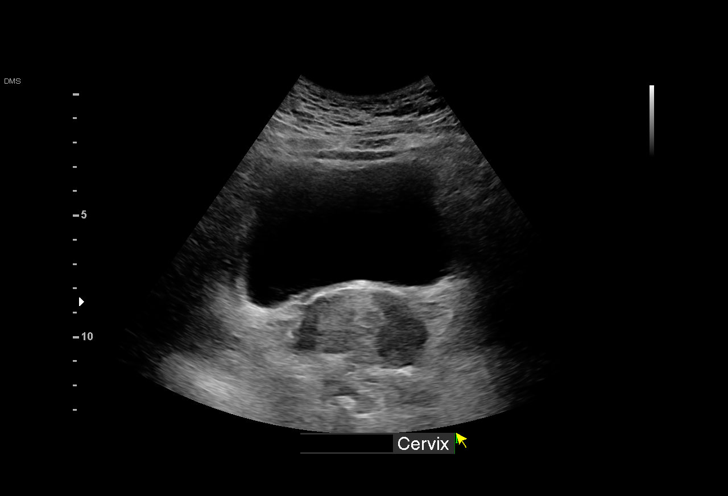
[im 2/13]
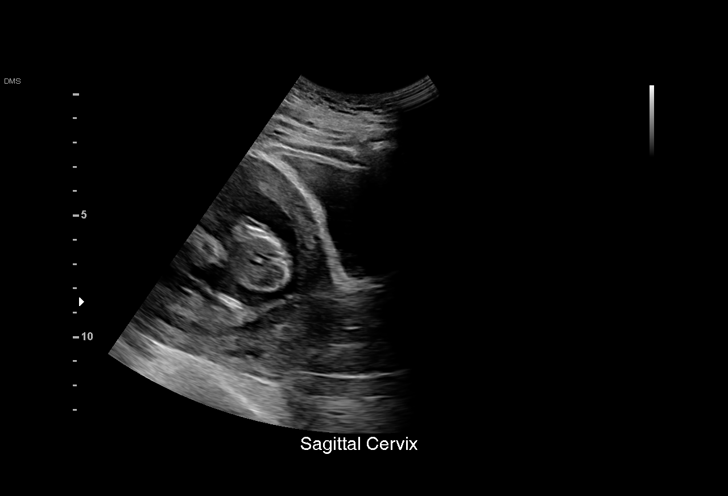
[im 3/13]
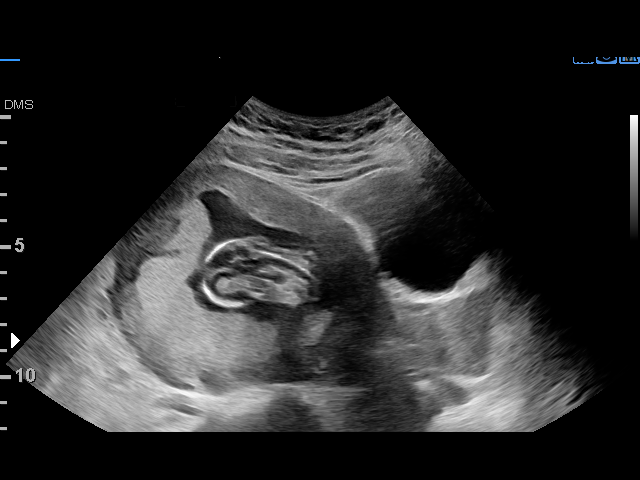
[im 4/13]
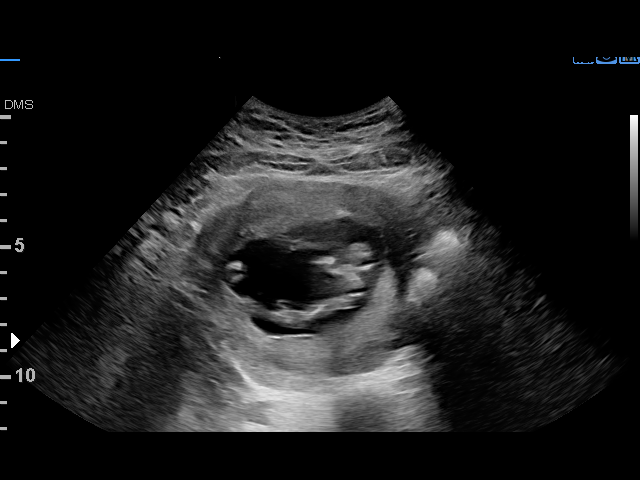
[im 5/13]
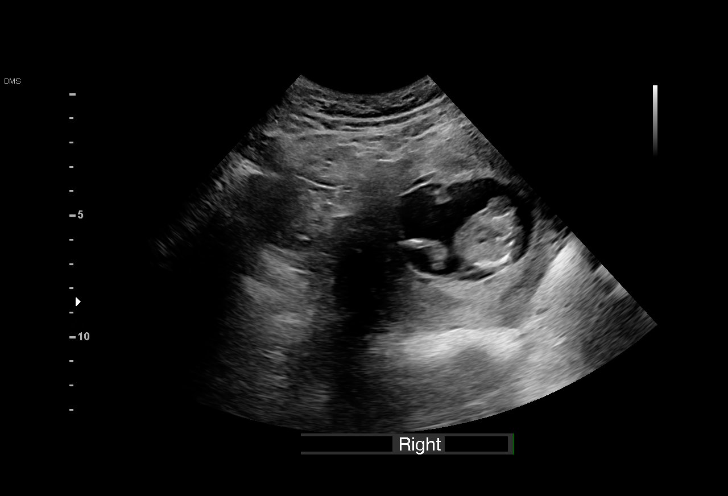
[im 6/13]
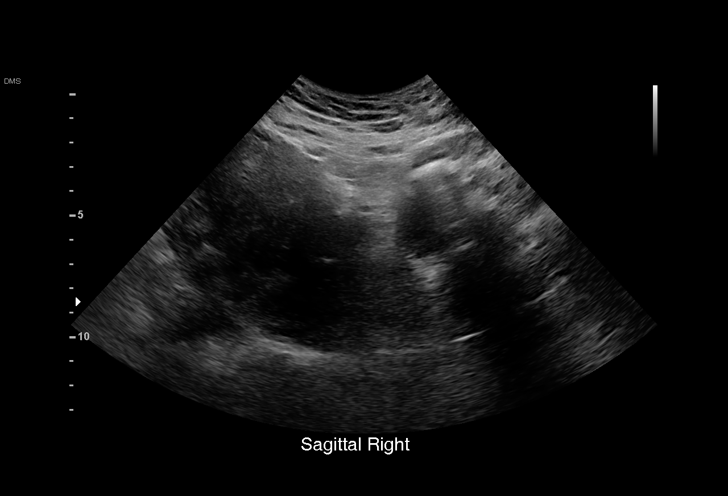
[im 7/13]
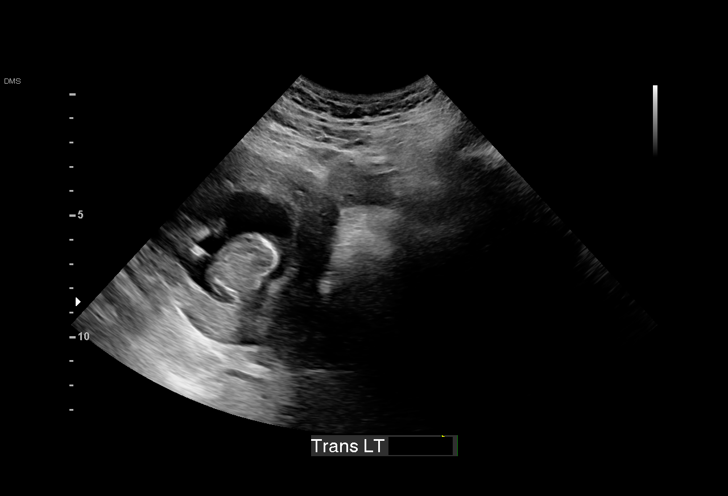
[im 8/13]
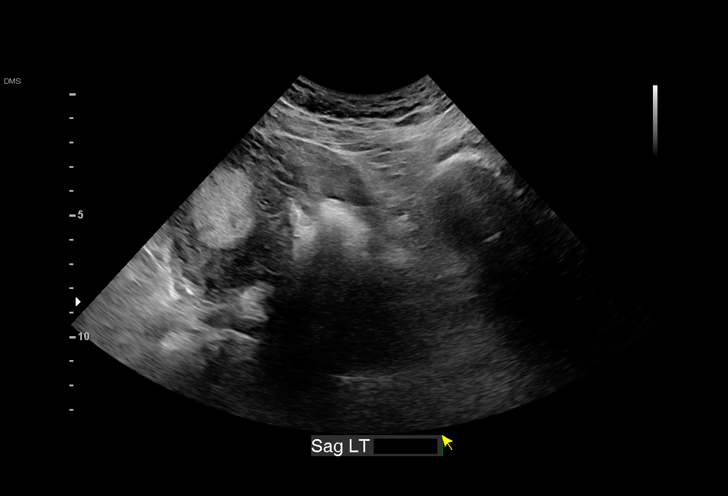
[im 9/13]
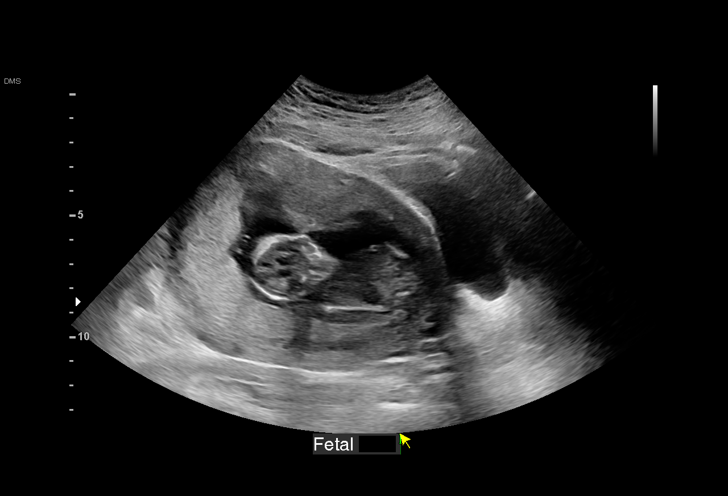
[im 10/13]
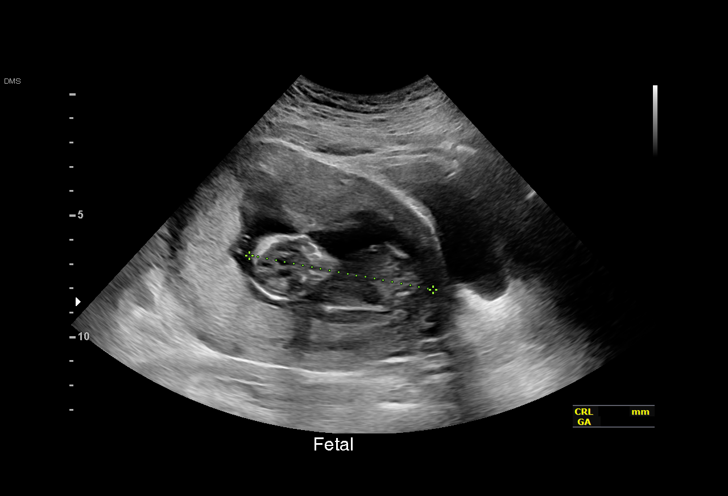
[im 11/13]
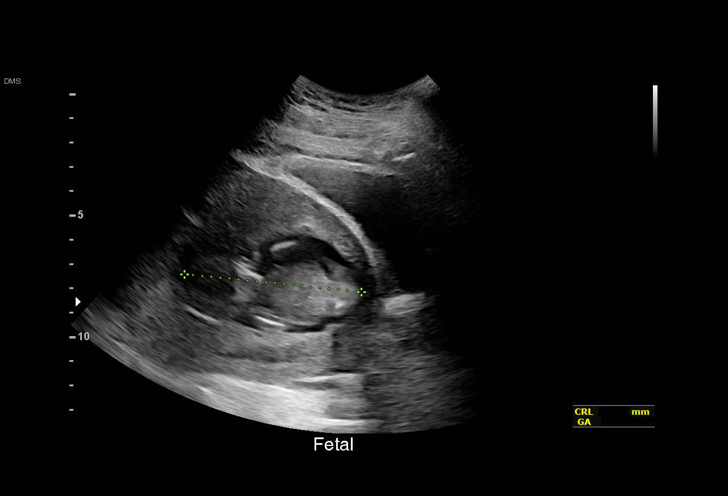
[im 12/13]
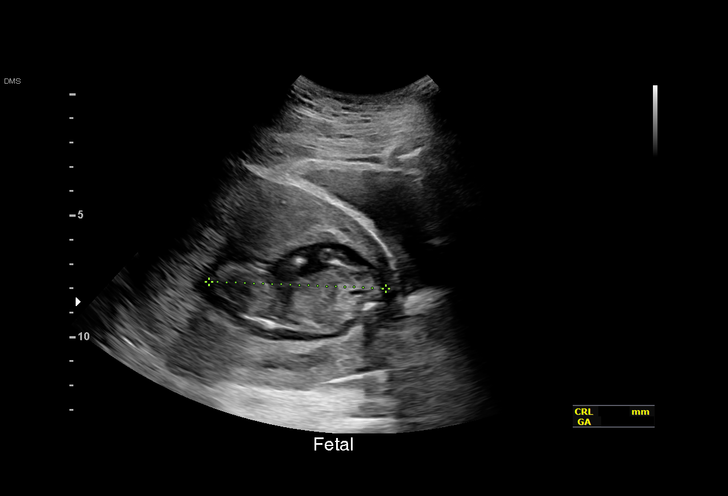
[im 13/13]
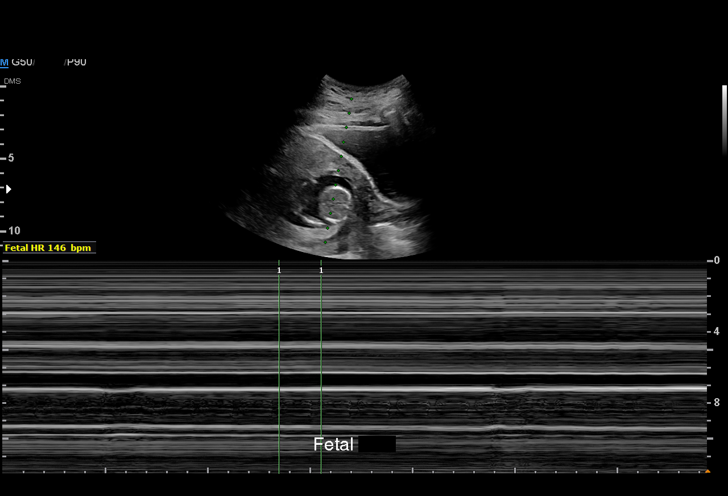

[13 of 13 positions shown; findings below may reference images not displayed]

FINDINGS: Intrauterine gestational sac: Single

Yolk sac:  Not Visualized.

Embryo:  Visualized.

Cardiac Activity: Visualized.

Heart Rate: 146 bpm

CRL:   74.2 mm   13 w 4 d                  US EDC: 03/14/2018

Subchorionic hemorrhage:  None visualized.

Maternal uterus/adnexae: Bilateral ovaries are not discretely
visualized.

No free fluid.
IMPRESSION: Single live intrauterine gestation, with estimated gestational age
13 weeks 4 days by crown-rump length, as above.
